# Patient Record
Sex: Male | Born: 1960
Health system: Southern US, Community
[De-identification: ages and names within clinical notes are randomized; demographics above are authoritative.]

---

## 2019-02-18 DIAGNOSIS — H6121 Impacted cerumen, right ear: Secondary | ICD-10-CM | POA: Diagnosis not present

## 2019-02-18 DIAGNOSIS — Z23 Encounter for immunization: Secondary | ICD-10-CM | POA: Diagnosis not present

## 2019-02-18 DIAGNOSIS — H60331 Swimmer's ear, right ear: Secondary | ICD-10-CM | POA: Diagnosis not present

## 2019-12-18 DIAGNOSIS — H6123 Impacted cerumen, bilateral: Secondary | ICD-10-CM | POA: Diagnosis not present

## 2019-12-18 DIAGNOSIS — H60393 Other infective otitis externa, bilateral: Secondary | ICD-10-CM | POA: Diagnosis not present

## 2020-01-16 DIAGNOSIS — M5441 Lumbago with sciatica, right side: Secondary | ICD-10-CM | POA: Diagnosis not present

## 2020-01-16 DIAGNOSIS — R29898 Other symptoms and signs involving the musculoskeletal system: Secondary | ICD-10-CM | POA: Diagnosis not present

## 2020-01-18 DIAGNOSIS — M48061 Spinal stenosis, lumbar region without neurogenic claudication: Secondary | ICD-10-CM | POA: Diagnosis not present

## 2020-01-18 DIAGNOSIS — M47816 Spondylosis without myelopathy or radiculopathy, lumbar region: Secondary | ICD-10-CM | POA: Diagnosis not present

## 2020-01-18 DIAGNOSIS — M5126 Other intervertebral disc displacement, lumbar region: Secondary | ICD-10-CM | POA: Diagnosis not present

## 2020-01-26 DIAGNOSIS — M5126 Other intervertebral disc displacement, lumbar region: Secondary | ICD-10-CM | POA: Diagnosis not present

## 2020-01-26 DIAGNOSIS — M79604 Pain in right leg: Secondary | ICD-10-CM | POA: Diagnosis not present

## 2020-01-26 DIAGNOSIS — M5441 Lumbago with sciatica, right side: Secondary | ICD-10-CM | POA: Diagnosis not present

## 2020-01-26 DIAGNOSIS — M5127 Other intervertebral disc displacement, lumbosacral region: Secondary | ICD-10-CM | POA: Diagnosis not present

## 2020-01-26 DIAGNOSIS — R200 Anesthesia of skin: Secondary | ICD-10-CM | POA: Diagnosis not present

## 2020-01-31 DIAGNOSIS — M5416 Radiculopathy, lumbar region: Secondary | ICD-10-CM | POA: Diagnosis not present

## 2020-02-05 DIAGNOSIS — M2560 Stiffness of unspecified joint, not elsewhere classified: Secondary | ICD-10-CM | POA: Diagnosis not present

## 2020-02-05 DIAGNOSIS — M5416 Radiculopathy, lumbar region: Secondary | ICD-10-CM | POA: Diagnosis not present

## 2020-02-05 DIAGNOSIS — M545 Low back pain: Secondary | ICD-10-CM | POA: Diagnosis not present

## 2020-02-21 DIAGNOSIS — M5416 Radiculopathy, lumbar region: Secondary | ICD-10-CM | POA: Diagnosis not present

## 2020-02-21 DIAGNOSIS — M545 Low back pain: Secondary | ICD-10-CM | POA: Diagnosis not present

## 2020-02-21 DIAGNOSIS — M2560 Stiffness of unspecified joint, not elsewhere classified: Secondary | ICD-10-CM | POA: Diagnosis not present

## 2020-02-23 DIAGNOSIS — M545 Low back pain: Secondary | ICD-10-CM | POA: Diagnosis not present

## 2020-02-23 DIAGNOSIS — M5416 Radiculopathy, lumbar region: Secondary | ICD-10-CM | POA: Diagnosis not present

## 2020-02-23 DIAGNOSIS — M2560 Stiffness of unspecified joint, not elsewhere classified: Secondary | ICD-10-CM | POA: Diagnosis not present

## 2020-02-26 DIAGNOSIS — M545 Low back pain: Secondary | ICD-10-CM | POA: Diagnosis not present

## 2020-02-26 DIAGNOSIS — M5416 Radiculopathy, lumbar region: Secondary | ICD-10-CM | POA: Diagnosis not present

## 2020-02-26 DIAGNOSIS — M2560 Stiffness of unspecified joint, not elsewhere classified: Secondary | ICD-10-CM | POA: Diagnosis not present

## 2020-02-28 DIAGNOSIS — M545 Low back pain: Secondary | ICD-10-CM | POA: Diagnosis not present

## 2020-02-28 DIAGNOSIS — M2560 Stiffness of unspecified joint, not elsewhere classified: Secondary | ICD-10-CM | POA: Diagnosis not present

## 2020-02-28 DIAGNOSIS — M5416 Radiculopathy, lumbar region: Secondary | ICD-10-CM | POA: Diagnosis not present

## 2020-03-05 DIAGNOSIS — M545 Low back pain: Secondary | ICD-10-CM | POA: Diagnosis not present

## 2020-03-05 DIAGNOSIS — M5416 Radiculopathy, lumbar region: Secondary | ICD-10-CM | POA: Diagnosis not present

## 2020-03-05 DIAGNOSIS — M2560 Stiffness of unspecified joint, not elsewhere classified: Secondary | ICD-10-CM | POA: Diagnosis not present

## 2020-03-07 DIAGNOSIS — M545 Low back pain: Secondary | ICD-10-CM | POA: Diagnosis not present

## 2020-03-07 DIAGNOSIS — M5416 Radiculopathy, lumbar region: Secondary | ICD-10-CM | POA: Diagnosis not present

## 2020-03-07 DIAGNOSIS — M2560 Stiffness of unspecified joint, not elsewhere classified: Secondary | ICD-10-CM | POA: Diagnosis not present

## 2020-03-08 ENCOUNTER — Ambulatory Visit (INDEPENDENT_AMBULATORY_CARE_PROVIDER_SITE_OTHER): Payer: BC Managed Care – PPO | Admitting: Cardiovascular Disease

## 2020-03-08 ENCOUNTER — Telehealth (HOSPITAL_COMMUNITY): Payer: Self-pay | Admitting: *Deleted

## 2020-03-08 ENCOUNTER — Other Ambulatory Visit (HOSPITAL_COMMUNITY)
Admission: RE | Admit: 2020-03-08 | Discharge: 2020-03-08 | Disposition: A | Discharge status: Home / Self Care | Payer: BC Managed Care – PPO | Source: Ambulatory Visit | Attending: Cardiovascular Disease | Admitting: Cardiovascular Disease

## 2020-03-08 ENCOUNTER — Encounter: Payer: Self-pay | Admitting: Cardiovascular Disease

## 2020-03-08 ENCOUNTER — Other Ambulatory Visit (HOSPITAL_COMMUNITY): Payer: BC Managed Care – PPO

## 2020-03-08 ENCOUNTER — Other Ambulatory Visit: Payer: Self-pay

## 2020-03-08 VITALS — BP 128/92 | HR 65 | Ht 72.0 in | Wt 241.8 lb

## 2020-03-08 DIAGNOSIS — Z136 Encounter for screening for cardiovascular disorders: Secondary | ICD-10-CM | POA: Diagnosis not present

## 2020-03-08 DIAGNOSIS — Z01812 Encounter for preprocedural laboratory examination: Secondary | ICD-10-CM | POA: Insufficient documentation

## 2020-03-08 DIAGNOSIS — Z20822 Contact with and (suspected) exposure to covid-19: Secondary | ICD-10-CM | POA: Diagnosis not present

## 2020-03-08 LAB — SARS CORONAVIRUS 2 (TAT 6-24 HRS): SARS Coronavirus 2: NEGATIVE

## 2020-03-08 NOTE — Progress Notes (Signed)
03/08/2020 Louis Mitchell   11-13-1960  [id number]  Primary Physician No primary care provider on file. Primary Cardiologist: Runell Gess MD Nicholes Calamity, [place]  HPI:  Louis Mitchell is a 59 y.o. mildly overweight married Caucasian male father of 3 children who is an Pharmacist, hospital and was referred by Dr. Assunta Found for routine GXT.  He has no cardiac risk factors.  He is fairly active.  He has never had a heart attack or stroke.  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   No outpatient medications have been marked as taking for the 03/08/20 encounter (Office Visit) with Runell Gess, MD.     No Known Allergies  Social History   Socioeconomic History  . Marital status: Married    Spouse name: Not on file  . Number of children: Not on file  . Years of education: Not on file  . Highest education level: Not on file  Occupational History  . Not on file  Tobacco Use  . Smoking status: Former Smoker    Types: Cigars  . Smokeless tobacco: Never Used  . Tobacco comment: maybe 1 or 2 cigars a year, been over a year since he's smoked one   Substance and Sexual Activity  . Alcohol use: Not on file  . Drug use: Not on file  . Sexual activity: Not on file  Other Topics Concern  . Not on file  Social History Narrative  . Not on file   Social Determinants of Health   Financial Resource Strain:   . Difficulty of Paying Living Expenses: Not on file  Food Insecurity:   . Worried About Programme researcher, broadcasting/film/video in the Last Year: Not on file  . Ran Out of Food in the Last Year: Not on file  Transportation Needs:   . Lack of Transportation (Medical): Not on file  . Lack of Transportation (Non-Medical): Not on file  Physical Activity:   . Days of Exercise per Week: Not on file  . Minutes of Exercise per Session: Not on file  Stress:   . Feeling of Stress : Not on file  Social Connections:   . Frequency of Communication with Friends and Family:  Not on file  . Frequency of Social Gatherings with Friends and Family: Not on file  . Attends Religious Services: Not on file  . Active Member of Clubs or Organizations: Not on file  . Attends Banker Meetings: Not on file  . Marital Status: Not on file  Intimate Partner Violence:   . Fear of Current or Ex-Partner: Not on file  . Emotionally Abused: Not on file  . Physically Abused: Not on file  . Sexually Abused: Not on file     Review of Systems: General: negative for chills, fever, night sweats or weight changes.  Cardiovascular: negative for chest pain, dyspnea on exertion, edema, orthopnea, palpitations, paroxysmal nocturnal dyspnea or shortness of breath Dermatological: negative for rash Respiratory: negative for cough or wheezing Urologic: negative for hematuria Abdominal: negative for nausea, vomiting, diarrhea, bright red blood per rectum, melena, or hematemesis Neurologic: negative for visual changes, syncope, or dizziness All other systems reviewed and are otherwise negative except as noted above.    Blood pressure (!) 128/92, pulse 65, height 6' (1.829 m), weight 241 lb 12.8 oz (109.7 kg), SpO2 100 %.  General appearance: alert and no distress Neck: no adenopathy, no carotid bruit, no  JVD, supple, symmetrical, trachea midline and thyroid not enlarged, symmetric, no tenderness/mass/nodules Lungs: clear to auscultation bilaterally Heart: regular rate and rhythm, S1, S2 normal, no murmur, click, rub or gallop Extremities: extremities normal, atraumatic, no cyanosis or edema Pulses: 2+ and symmetric Skin: Skin color, texture, turgor normal. No rashes or lesions Neurologic: Alert and oriented X 3, normal strength and tone. Normal symmetric reflexes. Normal coordination and gait  EKG sinus rhythm at 65 without ST or T wave changes.  I personally reviewed this EKG  ASSESSMENT AND PLAN:   No problem-specific Assessment & Plan notes found for this  encounter.      Runell Gess MD FACP,FACC,FAHA, [facility] 03/08/2020 11:43 AM

## 2020-03-08 NOTE — Patient Instructions (Signed)
Medication Instructions:  Your physician recommends that you continue on your current medications as directed. Please refer to the Current Medication list given to you today.  *If you need a refill on your cardiac medications before your next appointment, please call your pharmacy*   Lab Work: -None If you have labs (blood work) drawn today and your tests are completely normal, you will receive your results only by: Marland Kitchen MyChart Message (if you have MyChart) OR . A paper copy in the mail If you have any lab test that is abnormal or we need to change your treatment, we will call you to review the results.   Testing/Procedures: Covid test today.   Follow-Up: At [facility], you and your health needs are our priority.  As part of our continuing mission to provide you with exceptional heart care, we have created designated Provider Care Teams.  These Care Teams include your primary Cardiologist (physician) and Advanced Practice Providers (APPs -  Physician Assistants and Nurse Practitioners) who all work together to provide you with the care you need, when you need it.  We recommend signing up for the patient portal called "MyChart".  Sign up information is provided on this After Visit Summary.  MyChart is used to connect with patients for Virtual Visits (Telemedicine).  Patients are able to view lab/test results, encounter notes, upcoming appointments, etc.  Non-urgent messages can be sent to your provider as well.   To learn more about what you can do with MyChart, go to [email or web address].    Your next appointment:   1 year(s)  The format for your next appointment:   In Person  Provider:   Nanetta Batty, MD   Other Instructions

## 2020-03-08 NOTE — Telephone Encounter (Signed)
Close encounter 

## 2020-03-12 ENCOUNTER — Ambulatory Visit (HOSPITAL_COMMUNITY)
Admission: RE | Admit: 2020-03-12 | Discharge: 2020-03-12 | Disposition: A | Discharge status: Home / Self Care | Payer: BC Managed Care – PPO | Source: Ambulatory Visit | Attending: Cardiovascular Disease | Admitting: Cardiovascular Disease

## 2020-03-12 ENCOUNTER — Other Ambulatory Visit: Payer: Self-pay

## 2020-03-12 DIAGNOSIS — Z6833 Body mass index (BMI) 33.0-33.9, adult: Secondary | ICD-10-CM | POA: Diagnosis not present

## 2020-03-12 DIAGNOSIS — Z136 Encounter for screening for cardiovascular disorders: Secondary | ICD-10-CM

## 2020-03-12 DIAGNOSIS — Z0000 Encounter for general adult medical examination without abnormal findings: Secondary | ICD-10-CM | POA: Diagnosis not present

## 2020-03-12 DIAGNOSIS — E6609 Other obesity due to excess calories: Secondary | ICD-10-CM | POA: Diagnosis not present

## 2020-03-12 DIAGNOSIS — E7849 Other hyperlipidemia: Secondary | ICD-10-CM | POA: Diagnosis not present

## 2020-03-12 LAB — EXERCISE TOLERANCE TEST
Estimated workload: 10.4 METS
Exercise duration (min): 9 min
Exercise duration (sec): 15 s
MPHR: 162 {beats}/min
Peak HR: 160 {beats}/min
Percent HR: 98 %
Rest HR: 77 {beats}/min

## 2020-03-14 ENCOUNTER — Other Ambulatory Visit: Payer: Self-pay

## 2020-03-14 DIAGNOSIS — R9431 Abnormal electrocardiogram [ECG] [EKG]: Secondary | ICD-10-CM

## 2020-03-14 NOTE — Progress Notes (Signed)
ct 

## 2020-03-18 DIAGNOSIS — M545 Low back pain, unspecified: Secondary | ICD-10-CM | POA: Diagnosis not present

## 2020-03-18 DIAGNOSIS — M2560 Stiffness of unspecified joint, not elsewhere classified: Secondary | ICD-10-CM | POA: Diagnosis not present

## 2020-03-18 DIAGNOSIS — M5416 Radiculopathy, lumbar region: Secondary | ICD-10-CM | POA: Diagnosis not present

## 2020-03-20 DIAGNOSIS — M5416 Radiculopathy, lumbar region: Secondary | ICD-10-CM | POA: Diagnosis not present

## 2020-03-20 DIAGNOSIS — M545 Low back pain, unspecified: Secondary | ICD-10-CM | POA: Diagnosis not present

## 2020-03-20 DIAGNOSIS — M2560 Stiffness of unspecified joint, not elsewhere classified: Secondary | ICD-10-CM | POA: Diagnosis not present

## 2020-03-25 DIAGNOSIS — M2560 Stiffness of unspecified joint, not elsewhere classified: Secondary | ICD-10-CM | POA: Diagnosis not present

## 2020-03-25 DIAGNOSIS — M5416 Radiculopathy, lumbar region: Secondary | ICD-10-CM | POA: Diagnosis not present

## 2020-03-25 DIAGNOSIS — M545 Low back pain, unspecified: Secondary | ICD-10-CM | POA: Diagnosis not present

## 2020-03-26 ENCOUNTER — Telehealth: Payer: Self-pay

## 2020-03-26 NOTE — Telephone Encounter (Signed)
Spoke with patient about his Ca.Score appointment tomorrow. Shared the below information.   Per our conversation below is your appointment information.   Location: [address] [place], [address]  Wednesday, 10/20 @ 9:30 AM Please arrive 15 minutes early for your appointment.   The cost for the Calcium Score is $150 due at time of appointment, this is not charged to your insurance.   Patient given information to setup his MyChart, sent via text.   No additional questions at this time.

## 2020-03-27 ENCOUNTER — Ambulatory Visit (INDEPENDENT_AMBULATORY_CARE_PROVIDER_SITE_OTHER)
Admission: RE | Admit: 2020-03-27 | Discharge: 2020-03-27 | Disposition: A | Discharge status: Home / Self Care | Payer: Self-pay | Source: Ambulatory Visit | Attending: Cardiovascular Disease | Admitting: Cardiovascular Disease

## 2020-03-27 ENCOUNTER — Other Ambulatory Visit: Payer: Self-pay

## 2020-03-27 DIAGNOSIS — R9431 Abnormal electrocardiogram [ECG] [EKG]: Secondary | ICD-10-CM

## 2020-04-04 DIAGNOSIS — M2560 Stiffness of unspecified joint, not elsewhere classified: Secondary | ICD-10-CM | POA: Diagnosis not present

## 2020-04-04 DIAGNOSIS — M5416 Radiculopathy, lumbar region: Secondary | ICD-10-CM | POA: Diagnosis not present

## 2020-04-04 DIAGNOSIS — M545 Low back pain, unspecified: Secondary | ICD-10-CM | POA: Diagnosis not present

## 2020-04-17 DIAGNOSIS — M5416 Radiculopathy, lumbar region: Secondary | ICD-10-CM | POA: Diagnosis not present

## 2020-04-17 DIAGNOSIS — M2560 Stiffness of unspecified joint, not elsewhere classified: Secondary | ICD-10-CM | POA: Diagnosis not present

## 2020-04-17 DIAGNOSIS — M545 Low back pain, unspecified: Secondary | ICD-10-CM | POA: Diagnosis not present

## 2020-04-22 DIAGNOSIS — M5416 Radiculopathy, lumbar region: Secondary | ICD-10-CM | POA: Diagnosis not present

## 2020-04-22 DIAGNOSIS — M2560 Stiffness of unspecified joint, not elsewhere classified: Secondary | ICD-10-CM | POA: Diagnosis not present

## 2020-04-22 DIAGNOSIS — R221 Localized swelling, mass and lump, neck: Secondary | ICD-10-CM | POA: Diagnosis not present

## 2020-04-22 DIAGNOSIS — M545 Low back pain, unspecified: Secondary | ICD-10-CM | POA: Diagnosis not present

## 2020-05-06 DIAGNOSIS — M5416 Radiculopathy, lumbar region: Secondary | ICD-10-CM | POA: Diagnosis not present

## 2020-05-06 DIAGNOSIS — M2560 Stiffness of unspecified joint, not elsewhere classified: Secondary | ICD-10-CM | POA: Diagnosis not present

## 2020-05-06 DIAGNOSIS — M545 Low back pain, unspecified: Secondary | ICD-10-CM | POA: Diagnosis not present

## 2020-05-15 DIAGNOSIS — M5416 Radiculopathy, lumbar region: Secondary | ICD-10-CM | POA: Diagnosis not present

## 2020-05-15 DIAGNOSIS — M2560 Stiffness of unspecified joint, not elsewhere classified: Secondary | ICD-10-CM | POA: Diagnosis not present

## 2020-05-15 DIAGNOSIS — M545 Low back pain, unspecified: Secondary | ICD-10-CM | POA: Diagnosis not present

## 2020-05-16 DIAGNOSIS — R221 Localized swelling, mass and lump, neck: Secondary | ICD-10-CM | POA: Diagnosis not present

## 2020-07-10 DIAGNOSIS — Z0000 Encounter for general adult medical examination without abnormal findings: Secondary | ICD-10-CM | POA: Diagnosis not present

## 2020-07-10 DIAGNOSIS — K5190 Ulcerative colitis, unspecified, without complications: Secondary | ICD-10-CM | POA: Diagnosis not present

## 2020-07-11 DIAGNOSIS — M2560 Stiffness of unspecified joint, not elsewhere classified: Secondary | ICD-10-CM | POA: Diagnosis not present

## 2020-07-11 DIAGNOSIS — M545 Low back pain, unspecified: Secondary | ICD-10-CM | POA: Diagnosis not present

## 2020-07-11 DIAGNOSIS — M5416 Radiculopathy, lumbar region: Secondary | ICD-10-CM | POA: Diagnosis not present

## 2020-07-22 DIAGNOSIS — L820 Inflamed seborrheic keratosis: Secondary | ICD-10-CM | POA: Diagnosis not present

## 2020-07-22 DIAGNOSIS — D1801 Hemangioma of skin and subcutaneous tissue: Secondary | ICD-10-CM | POA: Diagnosis not present

## 2020-07-22 DIAGNOSIS — Z1283 Encounter for screening for malignant neoplasm of skin: Secondary | ICD-10-CM | POA: Diagnosis not present

## 2020-07-22 DIAGNOSIS — L218 Other seborrheic dermatitis: Secondary | ICD-10-CM | POA: Diagnosis not present

## 2020-07-30 DIAGNOSIS — Z01812 Encounter for preprocedural laboratory examination: Secondary | ICD-10-CM | POA: Diagnosis not present

## 2020-07-30 DIAGNOSIS — Z20828 Contact with and (suspected) exposure to other viral communicable diseases: Secondary | ICD-10-CM | POA: Diagnosis not present

## 2020-07-30 DIAGNOSIS — Z20822 Contact with and (suspected) exposure to covid-19: Secondary | ICD-10-CM | POA: Diagnosis not present

## 2020-07-30 DIAGNOSIS — Z1159 Encounter for screening for other viral diseases: Secondary | ICD-10-CM | POA: Diagnosis not present

## 2020-08-01 DIAGNOSIS — Z79899 Other long term (current) drug therapy: Secondary | ICD-10-CM | POA: Diagnosis not present

## 2020-08-01 DIAGNOSIS — K5190 Ulcerative colitis, unspecified, without complications: Secondary | ICD-10-CM | POA: Diagnosis not present

## 2020-08-01 DIAGNOSIS — Z8616 Personal history of COVID-19: Secondary | ICD-10-CM | POA: Diagnosis not present

## 2020-08-01 DIAGNOSIS — D84821 Immunodeficiency due to drugs: Secondary | ICD-10-CM | POA: Diagnosis not present

## 2020-08-01 DIAGNOSIS — Z7952 Long term (current) use of systemic steroids: Secondary | ICD-10-CM | POA: Diagnosis not present

## 2020-08-01 DIAGNOSIS — Z6833 Body mass index (BMI) 33.0-33.9, adult: Secondary | ICD-10-CM | POA: Diagnosis not present

## 2020-08-01 DIAGNOSIS — E669 Obesity, unspecified: Secondary | ICD-10-CM | POA: Diagnosis not present

## 2020-08-02 DIAGNOSIS — K6389 Other specified diseases of intestine: Secondary | ICD-10-CM | POA: Diagnosis not present

## 2020-08-02 DIAGNOSIS — K6289 Other specified diseases of anus and rectum: Secondary | ICD-10-CM | POA: Diagnosis not present

## 2020-10-09 DIAGNOSIS — K5190 Ulcerative colitis, unspecified, without complications: Secondary | ICD-10-CM | POA: Diagnosis not present

## 2021-05-11 IMAGING — CT CT CARDIAC CORONARY ARTERY CALCIUM SCORE
3 series · 14 of 20 positions shown, 15 images · non-contrast
Comparison: None.
COMPARISON: None.
COMPARISON: None.

Addendum:
EXAM:
OVER-READ INTERPRETATION  CT CHEST

The following report is an over-read performed by radiologist Dr.
Rosangely Sempai [REDACTED] on 03/27/2020. This
over-read does not include interpretation of cardiac or coronary
anatomy or pathology. The coronary calcium score interpretation by
the cardiologist is attached.
CLINICAL DATA: Risk stratification
Coronary Calcium Score
TECHNIQUE: The patient was scanned on a Siemens Force scanner. Axial
non-contrast 3 mm slices were carried out through the heart. The
data set was analyzed on a dedicated work station and scored using
the Agatson method.

[Series 2: casc 3.0 bv41 2 bestdiast 69 % · axial · 0.45mm/px · z∈[-216,-144]mm · 4 of 42 slices shown, 5 images]
[im 9/42  vessel]
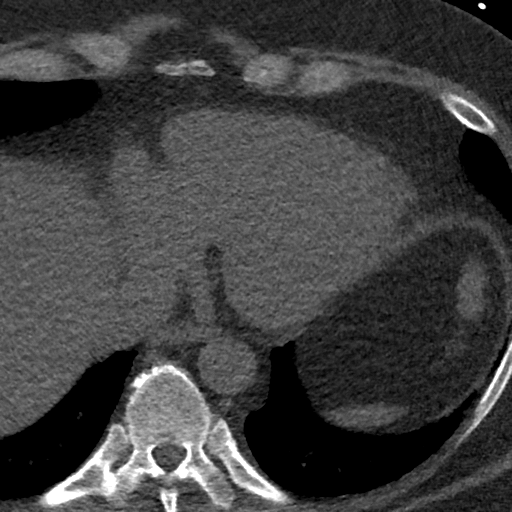
[im 9/42  lung]
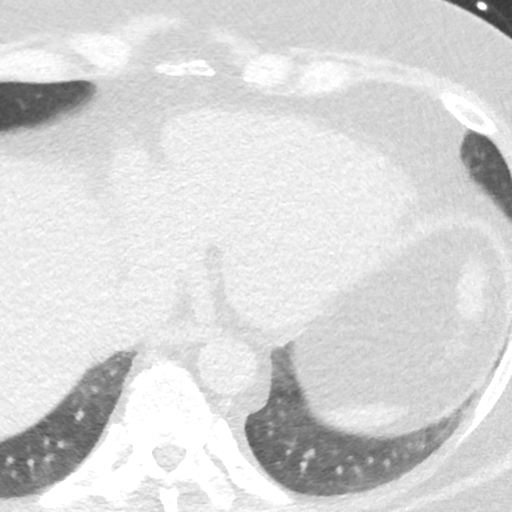
[im 17/42  vessel]
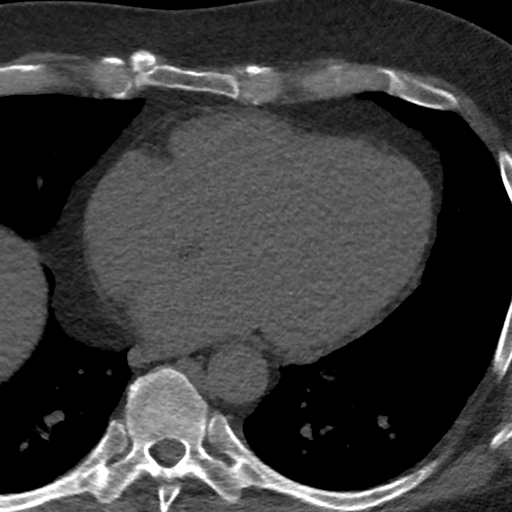
[im 25/42  vessel]
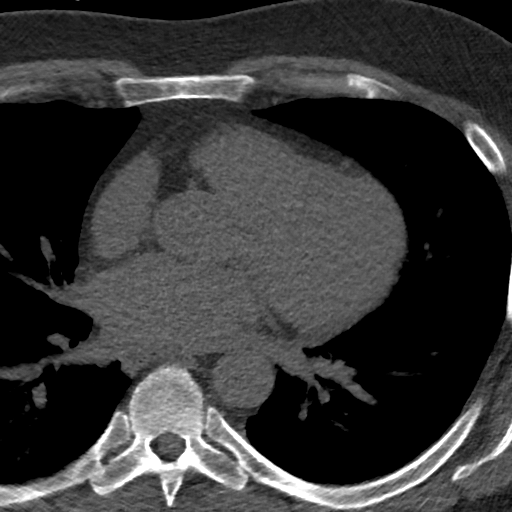
[im 33/42  vessel]
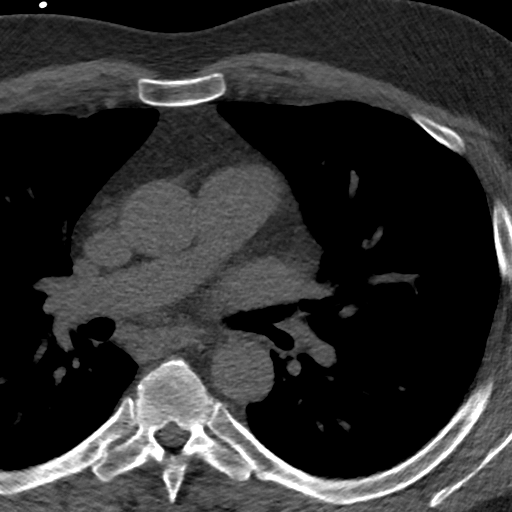

[Series 3: lung 69 % · axial · 0.81mm/px · z∈[-222,-138]mm · 5 of 42 slices shown]
[im 7/42  lung]
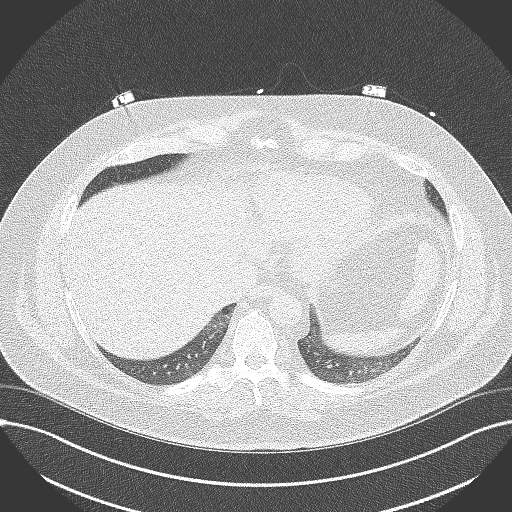
[im 14/42  lung]
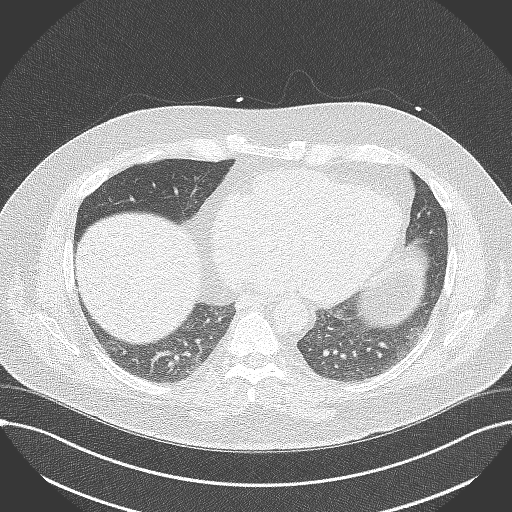
[im 21/42  lung]
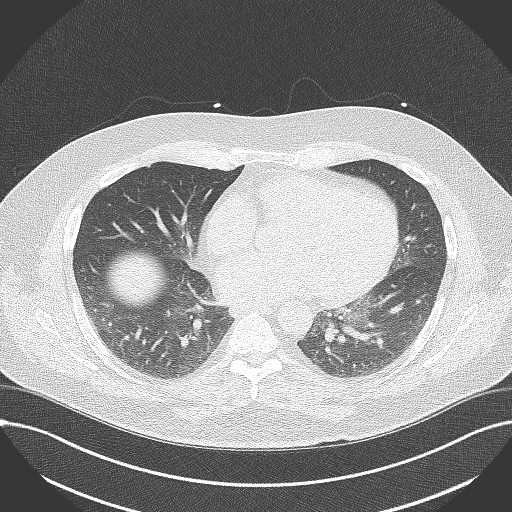
[im 28/42  lung]
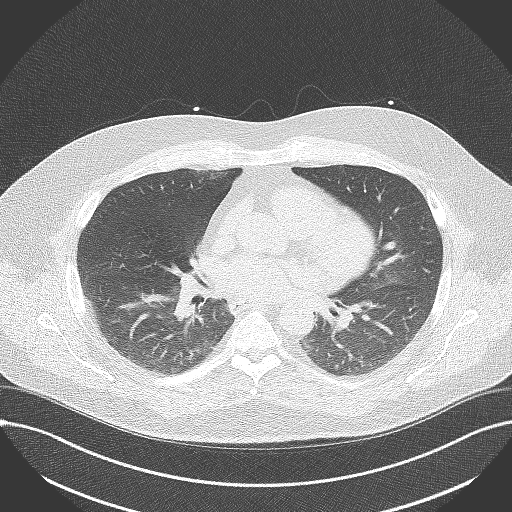
[im 35/42  lung]
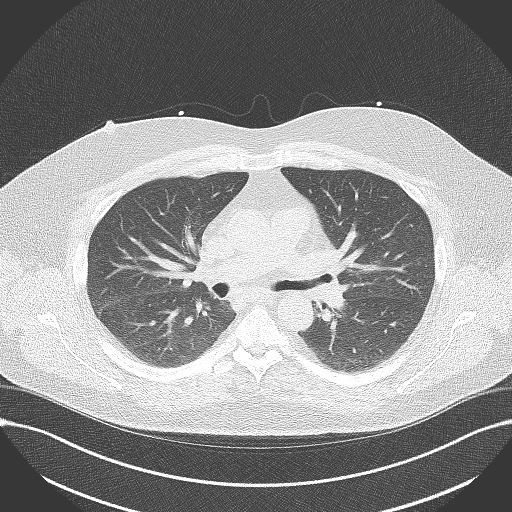

[Series 4: lung st 69 % · axial · 0.81mm/px · z∈[-222,-138]mm · 5 of 42 slices shown]
[im 7/42  lung]
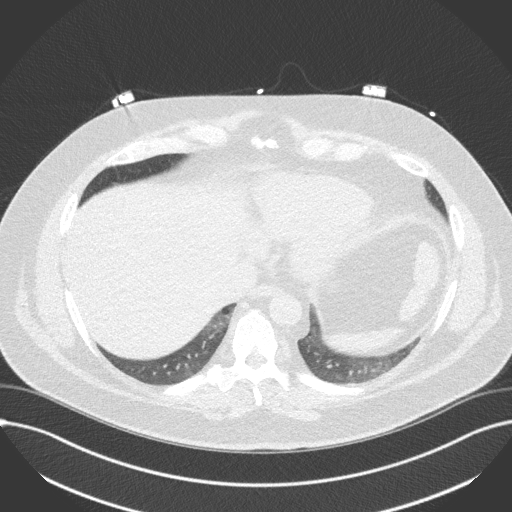
[im 14/42  lung]
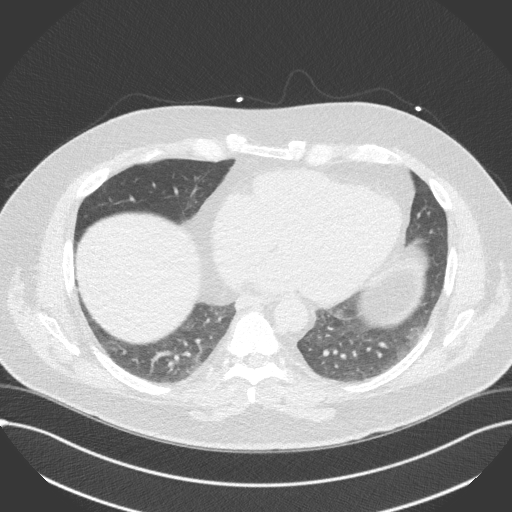
[im 21/42  lung]
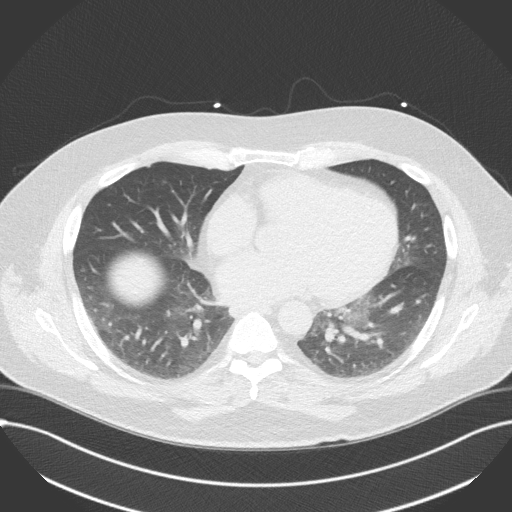
[im 28/42  lung]
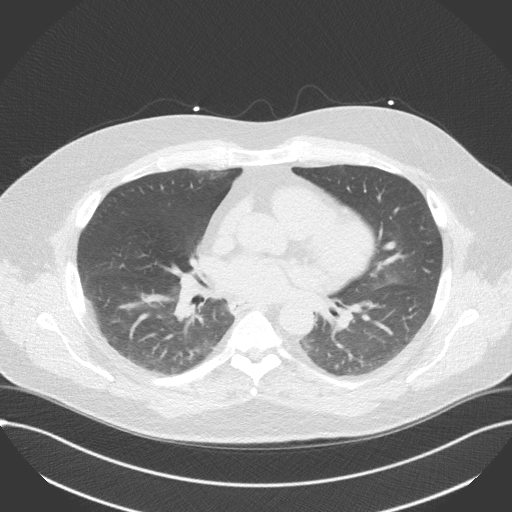
[im 35/42  lung]
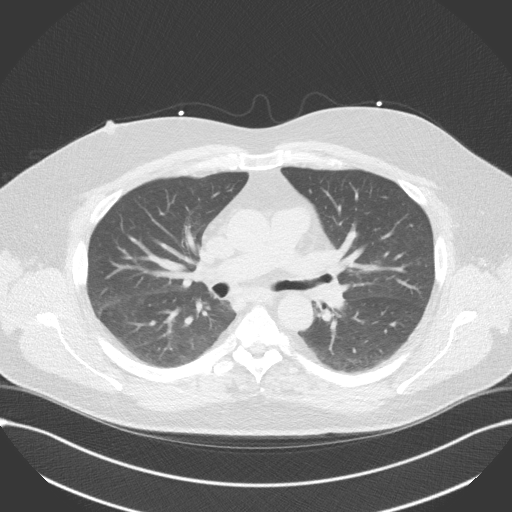

[14 of 20 positions shown; findings below may reference images not displayed]

FINDINGS: Within the visualized portions of the thorax there are no suspicious
appearing pulmonary nodules or masses, there is no acute
consolidative airspace disease, no pleural effusions, no
pneumothorax and no lymphadenopathy. Visualized portions of the
upper abdomen are unremarkable. There are no aggressive appearing
lytic or blastic lesions noted in the visualized portions of the
skeleton.
IMPRESSION: 1. No significant incidental noncardiac findings are noted.
FINDINGS: Non-cardiac: See separate report from [REDACTED].

Ascending Aorta: Normal caliber.  No calcifications.

Pericardium: Normal

Coronary arteries: Normal coronary origins.
IMPRESSION: Coronary calcium score of 15. This was 48th percentile for age and
sex matched control.

Sura Sipes

*** End of Addendum ***
Addendum:
EXAM:
OVER-READ INTERPRETATION  CT CHEST

The following report is an over-read performed by radiologist Dr.
Rosangely Sempai [REDACTED] on 03/27/2020. This
over-read does not include interpretation of cardiac or coronary
anatomy or pathology. The coronary calcium score interpretation by
the cardiologist is attached.
FINDINGS: Within the visualized portions of the thorax there are no suspicious
appearing pulmonary nodules or masses, there is no acute
consolidative airspace disease, no pleural effusions, no
pneumothorax and no lymphadenopathy. Visualized portions of the
upper abdomen are unremarkable. There are no aggressive appearing
lytic or blastic lesions noted in the visualized portions of the
skeleton.
IMPRESSION: 1. No significant incidental noncardiac findings are noted.
FINDINGS: Non-cardiac: See separate report from [REDACTED].

Ascending Aorta: Normal caliber.  No calcifications.

Pericardium: Normal

Coronary arteries: Normal coronary origins.
IMPRESSION: Coronary calcium score of 15. This was 48th percentile for age and
sex matched control.

Sura Sipes

*** End of Addendum ***
EXAM:
OVER-READ INTERPRETATION  CT CHEST

The following report is an over-read performed by radiologist Dr.
Rosangely Sempai [REDACTED] on 03/27/2020. This
over-read does not include interpretation of cardiac or coronary
anatomy or pathology. The coronary calcium score interpretation by
the cardiologist is attached.
FINDINGS: Within the visualized portions of the thorax there are no suspicious
appearing pulmonary nodules or masses, there is no acute
consolidative airspace disease, no pleural effusions, no
pneumothorax and no lymphadenopathy. Visualized portions of the
upper abdomen are unremarkable. There are no aggressive appearing
lytic or blastic lesions noted in the visualized portions of the
skeleton.
IMPRESSION: 1. No significant incidental noncardiac findings are noted.
